# Patient Record
Sex: Female | Born: 1980 | Race: Black or African American | Hispanic: No | Marital: Married | Smoking: Never smoker
Health system: Southern US, Community
[De-identification: ages and names within clinical notes are randomized; demographics above are authoritative.]

## PROBLEM LIST (undated history)

## (undated) DIAGNOSIS — R51 Headache: Secondary | ICD-10-CM

## (undated) DIAGNOSIS — O24419 Gestational diabetes mellitus in pregnancy, unspecified control: Secondary | ICD-10-CM

---

## 2006-09-06 ENCOUNTER — Inpatient Hospital Stay (HOSPITAL_COMMUNITY): Admission: AD | Admit: 2006-09-06 | Discharge: 2006-09-11 | Payer: Self-pay | Admitting: Obstetrics and Gynecology

## 2006-10-30 ENCOUNTER — Emergency Department (HOSPITAL_COMMUNITY): Admission: EM | Admit: 2006-10-30 | Discharge: 2006-10-31 | Payer: Self-pay | Admitting: Emergency Medicine

## 2010-01-30 DIAGNOSIS — O24419 Gestational diabetes mellitus in pregnancy, unspecified control: Secondary | ICD-10-CM

## 2010-01-30 HISTORY — DX: Gestational diabetes mellitus in pregnancy, unspecified control: O24.419

## 2010-03-02 ENCOUNTER — Encounter: Payer: Self-pay | Admitting: Obstetrics and Gynecology

## 2010-05-10 LAB — ANTIBODY SCREEN: Antibody Screen: NEGATIVE

## 2010-05-10 LAB — ABO/RH: RH Type: POSITIVE

## 2010-05-10 LAB — RUBELLA ANTIBODY, IGM: Rubella: IMMUNE

## 2010-05-25 ENCOUNTER — Other Ambulatory Visit: Payer: Self-pay | Admitting: Obstetrics and Gynecology

## 2010-05-25 DIAGNOSIS — E041 Nontoxic single thyroid nodule: Secondary | ICD-10-CM

## 2010-05-27 ENCOUNTER — Other Ambulatory Visit: Payer: Self-pay

## 2010-06-14 ENCOUNTER — Ambulatory Visit
Admission: RE | Admit: 2010-06-14 | Discharge: 2010-06-14 | Disposition: A | Discharge status: Home / Self Care | Payer: PRIVATE HEALTH INSURANCE | Source: Ambulatory Visit | Attending: Obstetrics and Gynecology | Admitting: Obstetrics and Gynecology

## 2010-06-14 DIAGNOSIS — E041 Nontoxic single thyroid nodule: Secondary | ICD-10-CM

## 2010-06-14 NOTE — Op Note (Signed)
Emily Friedman, Emily Friedman              ACCOUNT NO.:  [id number]   MEDICAL RECORD NO.:  [id number]          PATIENT TYPE:  INP   LOCATION:  9162                          FACILITY:  [facility]   PHYSICIAN:  Kendra H. Tenny Craw, MD     DATE OF BIRTH:  08-Aug-1980   DATE OF PROCEDURE:  09/08/2006  DATE OF DISCHARGE:                               OPERATIVE REPORT   PREOPERATIVE DIAGNOSIS:  1. 41 and 3-week intrauterine pregnancy.  2. Nonreassuring fetal well-being fetal intolerance to labor.  3. Cephalopelvic disproportion.   POSTOPERATIVE DIAGNOSIS:  1. 41 and 3-week intrauterine pregnancy.  2. Nonreassuring fetal well-being fetal intolerance to labor.  3. Cephalopelvic disproportion.   PROCEDURE:  Primary low transverse cesarean section via Pfannenstiel's  skin incision.   SURGEON:  Freddrick March. Tenny Craw, M.D.   ASSISTANTOsborne Casco, surgical tech.   ANESTHESIA:  Epidural.   SPECIMEN:  Placenta.   DISPOSITION:  Pathology to labor and delivery.   ESTIMATED BLOOD LOSS:  800 mL.   COMPLICATIONS:  Vacuum pop-off x3.   FINDINGS:  Viable female infant in the vertex presentation with Apgars 5  and 9 weighing 8 pounds 3 ounces.  Cord pH was drawn at the time of  delivery.  However, this clotted and workup was not able to be  processed.   PROCEDURE:  The patient is a 30 year old G1 P0 who was admitted on  September 06, 2006 for a post dates induction.  She underwent induction of  labor with misoprostol and Pitocin and made it to 4 cm dilated where she  remained for 4 hours. During this time the fetus developed recurrent  fetal heart rate decelerations and the patient will be placed in  Trendelenburg, Pitocin would be decreased, fetal heart tones was  returned to baseline and reactive however the patient could never get  into adequate labor and the fetus could not tolerate adequate  contractions to produce cervical change.  Therefore the decision was  made to proceed with primary cesarean section.   DESCRIPTION OF PROCEDURE:  Following the appropriate informed consent  the patient was brought to the operating room where epidural anesthesia  was found to be adequate.  She was placed in the dorsal supine position  with a leftward tilt, prepped and draped in normal sterile fashion.  Scalpel was used to make a Pfannenstiel's skin incision which was  carried down through the underlying layers of soft tissue to the fascia.  The fascia was incised in the midline.  The fascial incision was  extended laterally with Mayo scissors.  The superior aspect of the  fascial incision was grasped with Kocher clamps x2, tented up. The  underlying rectus muscle dissected off sharply with the electrocautery  unit.  The same procedure was repeated on the inferior aspect of the  fascial incision. The rectus muscles were then separated in the midline.  The abdominal peritoneum was identified, tented up and entered sharply  with the Metzenbaum scissors. The incision was extended superiorly and  inferiorly with good visualization of the bladder.  The bladder blade  was inserted.  It was noted  that upon attempting insertion of the  bladder blade the patient's rectus muscles were very very tight and  there was some difficulty stretching the abdominal muscles.  It was  attempted to extend the fascial dissection up further however this could  not be obtained. The vesicouterine peritoneum was then identified,  tented up, entered sharply Metzenbaum scissors and the incision was  extended laterally with the Metzenbaum and a bladder flap was created  digitally.  Scalpel was then used to make a low transverse incision on  the uterus which was extended laterally with blunt dissection.  The  fetal vertex was identified and brought up to the uterine incision.  The  vacuum extractor was used to assist with delivery, however, the infant's  head had very tight fit through the rectus muscles and the abdominal  muscles were so  contracted that the uterine incision could not be  extended further with bandage scissors.  The rectus muscles were then  transected slightly on either side to allow more room. The vacuum popped  off x3 before the eventual delivery of the infant's head followed by the  body.  The cord was clamped and cut and the infant was passed to the  waiting pediatricians. The placenta was then manually extracted.  The  uterus was exteriorized and cleared of all clot and debris. A small  extension towards the cervix of the uterine incision was noted on the  left-hand side of the uterus and the uterine incision was repaired with  #1 chromic in a running locked fashion and the second imbricating layer  was made.  The ovaries and tubes were inspected and found to be within  normal limits.  The uterine incision was inspected and found to be  hemostatic.  The uterus was then returned to the abdominal cavity.  The  abdominal cavity was irrigated and cleared of all clot and debris.  The  uterine incision was reinspected and found to be hemostatic and the  abdominal peritoneum was closed with 2-0 Vicryl in a running fashion and  the fascia was closed with a looped PDS in a running fashion and the  skin was closed with staples.  All sponge, lap, and needle counts were  correct x2.  The patient tolerated the procedure well and was brought to  the recovery room in stable condition following the procedure.      Freddrick March. Tenny Craw, MD  Electronically Signed     KHR/MEDQ  D:  09/08/2006  T:  09/09/2006  Job:  [id number]

## 2010-06-17 NOTE — Discharge Summary (Signed)
Emily Friedman, Emily Friedman              ACCOUNT NO.:  [id number]   MEDICAL RECORD NO.:  [id number]          PATIENT TYPE:  INP   LOCATION:  9102                          FACILITY:  [facility]   PHYSICIAN:  Carrington Clamp, M.D. DATE OF BIRTH:  1980/08/13   DATE OF ADMISSION:  09/06/2006  DATE OF DISCHARGE:  09/11/2006                               DISCHARGE SUMMARY   FINAL DIAGNOSES:  1. Intrauterine pregnancy at 69 and 3/7 weeks' gestation.  2. Induction of labor.  3. Nonreassuring fetal well-being and intolerance to labor.  4. Cephalopelvic disproportion.   PROCEDURE:  Primary low transverse cesarean section.   SURGEON:  Dr. Waynard Reeds.   COMPLICATIONS:  There was vacuum-assisted delivery with pop-off x3.  No  other complications.   This 30 year old G1, P0 was admitted on August 7 for an induction  secondary to post date.  The patient's antepartum course up to this  point had been uncomplicated.  The patient had an uneventful antepartum  course.  She had a negative group B strep culture obtained in our office  at 36 weeks.  She was admitted to the hospital on the 7th for this  induction of labor.  She was given misoprostol and Pitocin. She made it  to about 4 cm of dilation.  At this point, the fetus started developing  some recurrent late fetal heart rate decelerations, and the patient was  placed in Trendelenburg and Pitocin was decreased.  Fetal heart tones  did return to baseline.  However, the patient could never get into an  adequate labor pattern without the abnormality of the fetal heart rate.  At this point, a decision was made to proceed with a cesarean section  secondary to nonreassuring fetal well-being with intolerance to labor.  The patient was taken to the operating room by Dr. Waynard Reeds on September 08, 2006, where a primary low transverse cesarean section was performed  with the delivery of an 8-pound 3-ounce female infant with Apgars of 5 and  9.  Cord pH was unable to  be obtained secondary to clotting.  Delivery  went without complications.  The patient's postoperative course was  benign.  She did have some postoperative anemia and was started on some  iron.  She did have her little boy circumcised before discharge, and she  was felt ready for discharge on postoperative day #3.  She received the  rubella vaccine prior to discharge which she had needed.  She was sent  home with Percocet 1 to 2 every 4 to 6 hours as needed for pain, told to  continue her vitamins, and was to follow up in our office in 4 weeks.  Instructions and precautions were reviewed with the patient.   LABS ON DISCHARGE:  The patient had a hemoglobin of 10.4, white blood  cell count of 10.9, and platelets of 194,000.      Leilani Able, P.A.-C.      Carrington Clamp, M.D.  Electronically Signed    MB/MEDQ  D:  10/10/2006  T:  10/11/2006  Job:  [id number]

## 2010-09-07 ENCOUNTER — Encounter: Payer: PRIVATE HEALTH INSURANCE | Attending: Obstetrics and Gynecology

## 2010-09-07 DIAGNOSIS — Z713 Dietary counseling and surveillance: Secondary | ICD-10-CM | POA: Insufficient documentation

## 2010-09-07 DIAGNOSIS — O99810 Abnormal glucose complicating pregnancy: Secondary | ICD-10-CM | POA: Insufficient documentation

## 2010-09-07 NOTE — Progress Notes (Signed)
  Patient was seen on 09/07/2010 for Gestational Diabetes self-management class at the [facility]. The following learning objectives were met by the patient during this course:   States the definition of Gestational Diabetes  States why dietary management is important in controlling blood glucose  Describes the effects each nutrient has on blood glucose levels  Demonstrates ability to create a balanced meal plan  Demonstrates carbohydrate counting   States when to check blood glucose levels  Demonstrates proper blood glucose monitoring techniques  States the effect of stress and exercise on blood glucose levels  States the importance of limiting caffeine and abstaining from alcohol and smoking  Blood glucose monitor given: Accu-Chek Nano (SmartView blood Glucose Monitoring Kit) Lot # [id number] Exp: 12/30/2011 Blood glucose reading: 120 (This was a fasting level, she has been fasting for religious holiday.)  Patient instructed to monitor glucose levels:Fasteing and 2 hours after first bite each meal. FBS: 60 - <90 1 hour: <140 2 hour: <120  Patient will be seen for follow-up as needed.  Asked to call or e-mail with questions

## 2010-10-12 LAB — STREP B DNA PROBE: GBS: POSITIVE

## 2010-11-06 ENCOUNTER — Encounter (HOSPITAL_COMMUNITY): Payer: Self-pay | Admitting: Obstetrics and Gynecology

## 2010-11-06 ENCOUNTER — Encounter (HOSPITAL_COMMUNITY): Payer: Self-pay

## 2010-11-06 ENCOUNTER — Inpatient Hospital Stay (HOSPITAL_COMMUNITY): Payer: PRIVATE HEALTH INSURANCE

## 2010-11-06 ENCOUNTER — Encounter (HOSPITAL_COMMUNITY): Payer: Self-pay | Admitting: *Deleted

## 2010-11-06 ENCOUNTER — Inpatient Hospital Stay (HOSPITAL_COMMUNITY)
Admission: AD | Admit: 2010-11-06 | Discharge: 2010-11-09 | DRG: 766 | Disposition: A | Discharge status: Home / Self Care | Payer: PRIVATE HEALTH INSURANCE | Source: Ambulatory Visit | Attending: Obstetrics & Gynecology | Admitting: Obstetrics & Gynecology

## 2010-11-06 ENCOUNTER — Encounter (HOSPITAL_COMMUNITY)
Admission: AD | Disposition: A | Discharge status: Home / Self Care | Payer: Self-pay | Source: Ambulatory Visit | Attending: Obstetrics & Gynecology

## 2010-11-06 DIAGNOSIS — O99814 Abnormal glucose complicating childbirth: Secondary | ICD-10-CM | POA: Diagnosis present

## 2010-11-06 DIAGNOSIS — O34219 Maternal care for unspecified type scar from previous cesarean delivery: Principal | ICD-10-CM | POA: Diagnosis present

## 2010-11-06 HISTORY — DX: Gestational diabetes mellitus in pregnancy, unspecified control: O24.419

## 2010-11-06 LAB — ABO/RH: ABO/RH(D): O POS

## 2010-11-06 LAB — CBC
HCT: 33.3 % — ABNORMAL LOW (ref 36.0–46.0)
MCH: 29.5 pg (ref 26.0–34.0)
MCHC: 33 g/dL (ref 30.0–36.0)
RBC: 3.73 MIL/uL — ABNORMAL LOW (ref 3.87–5.11)

## 2010-11-06 LAB — RPR: RPR Ser Ql: NONREACTIVE

## 2010-11-06 SURGERY — Surgical Case
Anesthesia: Regional | Site: Abdomen | Wound class: Clean Contaminated

## 2010-11-06 MED ORDER — LANOLIN HYDROUS EX OINT: 1.0000 "application " | TOPICAL_OINTMENT | CUTANEOUS | Status: DC | PRN

## 2010-11-06 MED ORDER — WITCH HAZEL-GLYCERIN EX PADS: 1.0000 "application " | MEDICATED_PAD | CUTANEOUS | Status: DC | PRN

## 2010-11-06 MED ORDER — SENNOSIDES-DOCUSATE SODIUM 8.6-50 MG PO TABS
2.0000 | ORAL_TABLET | Freq: Every day | ORAL | Status: DC
  Administered 2010-11-07 – 2010-11-08 (×2): 2 via ORAL

## 2010-11-06 MED ORDER — SODIUM CHLORIDE 0.9 % IJ SOLN: 3.0000 mL | INTRAMUSCULAR | Status: DC | PRN

## 2010-11-06 MED ORDER — HYDROMORPHONE HCL 1 MG/ML IJ SOLN: 0.2500 mg | INTRAMUSCULAR | Status: DC | PRN

## 2010-11-06 MED ORDER — FAMOTIDINE IN NACL 20-0.9 MG/50ML-% IV SOLN
20.0000 mg | Freq: Once | INTRAVENOUS | Status: AC
  Administered 2010-11-06: 20 mg via INTRAVENOUS

## 2010-11-06 MED ORDER — MORPHINE SULFATE (PF) 0.5 MG/ML IJ SOLN
INTRAMUSCULAR | Status: DC | PRN
  Administered 2010-11-06: .1 mg via INTRATHECAL

## 2010-11-06 MED ORDER — OXYTOCIN 20 UNITS IN LACTATED RINGERS INFUSION - SIMPLE
INTRAVENOUS | Status: DC | PRN
  Administered 2010-11-06: 20 [IU] via INTRAVENOUS

## 2010-11-06 MED ORDER — SODIUM CHLORIDE 0.9 % IV SOLN
1.0000 ug/kg/h | INTRAVENOUS | Status: DC | PRN
  Filled 2010-11-06: qty 2.5

## 2010-11-06 MED ORDER — NALOXONE HCL 0.4 MG/ML IJ SOLN: 0.4000 mg | INTRAMUSCULAR | Status: DC | PRN

## 2010-11-06 MED ORDER — EPHEDRINE SULFATE 50 MG/ML IJ SOLN
INTRAMUSCULAR | Status: DC | PRN
  Administered 2010-11-06: 5 mg via INTRAVENOUS
  Administered 2010-11-06: 15 mg via INTRAVENOUS
  Administered 2010-11-06: 5 mg via INTRAVENOUS
  Administered 2010-11-06: 10 mg via INTRAVENOUS

## 2010-11-06 MED ORDER — SIMETHICONE 80 MG PO CHEW: 80.0000 mg | CHEWABLE_TABLET | ORAL | Status: DC | PRN

## 2010-11-06 MED ORDER — NALBUPHINE SYRINGE 5 MG/0.5 ML
5.0000 mg | INJECTION | INTRAMUSCULAR | Status: DC | PRN
  Filled 2010-11-06: qty 1

## 2010-11-06 MED ORDER — OXYTOCIN 20 UNITS IN LACTATED RINGERS INFUSION - SIMPLE
125.0000 mL/h | INTRAVENOUS | Status: AC
  Administered 2010-11-06: 125 mL/h via INTRAVENOUS

## 2010-11-06 MED ORDER — FAMOTIDINE IN NACL 20-0.9 MG/50ML-% IV SOLN
INTRAVENOUS | Status: AC
  Administered 2010-11-06: 20 mg via INTRAVENOUS
  Filled 2010-11-06: qty 50

## 2010-11-06 MED ORDER — FENTANYL CITRATE 0.05 MG/ML IJ SOLN
INTRAMUSCULAR | Status: DC | PRN
  Administered 2010-11-06: 15 ug via INTRAVENOUS

## 2010-11-06 MED ORDER — ONDANSETRON HCL 4 MG/2ML IJ SOLN
4.0000 mg | Freq: Three times a day (TID) | INTRAMUSCULAR | Status: DC | PRN
  Filled 2010-11-06: qty 2

## 2010-11-06 MED ORDER — MENTHOL 3 MG MT LOZG: 1.0000 | LOZENGE | OROMUCOSAL | Status: DC | PRN

## 2010-11-06 MED ORDER — KETOROLAC TROMETHAMINE 30 MG/ML IJ SOLN: 30.0000 mg | Freq: Four times a day (QID) | INTRAMUSCULAR | Status: AC | PRN

## 2010-11-06 MED ORDER — ONDANSETRON HCL 4 MG PO TABS: 4.0000 mg | ORAL_TABLET | ORAL | Status: DC | PRN

## 2010-11-06 MED ORDER — DIPHENHYDRAMINE HCL 25 MG PO CAPS: 25.0000 mg | ORAL_CAPSULE | Freq: Four times a day (QID) | ORAL | Status: DC | PRN

## 2010-11-06 MED ORDER — CITRIC ACID-SODIUM CITRATE 334-500 MG/5ML PO SOLN
30.0000 mL | Freq: Once | ORAL | Status: AC
  Administered 2010-11-06: 30 mL via ORAL

## 2010-11-06 MED ORDER — SCOPOLAMINE 1 MG/3DAYS TD PT72
1.0000 | MEDICATED_PATCH | Freq: Once | TRANSDERMAL | Status: DC
  Administered 2010-11-06: 1.5 mg via TRANSDERMAL
  Filled 2010-11-06: qty 1

## 2010-11-06 MED ORDER — SODIUM CHLORIDE 0.9 % IR SOLN
Status: DC | PRN
  Administered 2010-11-06: 1000 mL

## 2010-11-06 MED ORDER — CITRIC ACID-SODIUM CITRATE 334-500 MG/5ML PO SOLN
ORAL | Status: AC
  Administered 2010-11-06: 30 mL via ORAL
  Filled 2010-11-06: qty 15

## 2010-11-06 MED ORDER — METOCLOPRAMIDE HCL 5 MG/ML IJ SOLN
10.0000 mg | Freq: Once | INTRAMUSCULAR | Status: AC
  Administered 2010-11-06: 10 mg via INTRAVENOUS
  Filled 2010-11-06: qty 2

## 2010-11-06 MED ORDER — DIPHENHYDRAMINE HCL 50 MG/ML IJ SOLN: 12.5000 mg | INTRAMUSCULAR | Status: DC | PRN

## 2010-11-06 MED ORDER — TETANUS-DIPHTH-ACELL PERTUSSIS 5-2.5-18.5 LF-MCG/0.5 IM SUSP
0.5000 mL | Freq: Once | INTRAMUSCULAR | Status: AC
  Administered 2010-11-07: 0.5 mL via INTRAMUSCULAR

## 2010-11-06 MED ORDER — OXYCODONE-ACETAMINOPHEN 5-325 MG PO TABS
1.0000 | ORAL_TABLET | ORAL | Status: DC | PRN
  Administered 2010-11-07 – 2010-11-08 (×5): 1 via ORAL
  Filled 2010-11-06 (×5): qty 1

## 2010-11-06 MED ORDER — ONDANSETRON HCL 4 MG/2ML IJ SOLN
INTRAMUSCULAR | Status: DC | PRN
  Administered 2010-11-06: 4 mg via INTRAVENOUS

## 2010-11-06 MED ORDER — SIMETHICONE 80 MG PO CHEW
80.0000 mg | CHEWABLE_TABLET | Freq: Three times a day (TID) | ORAL | Status: DC
  Administered 2010-11-07 – 2010-11-08 (×8): 80 mg via ORAL

## 2010-11-06 MED ORDER — DIPHENHYDRAMINE HCL 50 MG/ML IJ SOLN: 25.0000 mg | INTRAMUSCULAR | Status: DC | PRN

## 2010-11-06 MED ORDER — DIBUCAINE 1 % RE OINT: 1.0000 "application " | TOPICAL_OINTMENT | RECTAL | Status: DC | PRN

## 2010-11-06 MED ORDER — METOCLOPRAMIDE HCL 5 MG/ML IJ SOLN
INTRAMUSCULAR | Status: AC
  Administered 2010-11-06: 10 mg via INTRAVENOUS
  Filled 2010-11-06: qty 2

## 2010-11-06 MED ORDER — PRENATAL PLUS 27-1 MG PO TABS
1.0000 | ORAL_TABLET | Freq: Every day | ORAL | Status: DC
  Administered 2010-11-07 – 2010-11-08 (×2): 1 via ORAL
  Filled 2010-11-06 (×3): qty 1

## 2010-11-06 MED ORDER — NALBUPHINE SYRINGE 5 MG/0.5 ML
5.0000 mg | INJECTION | INTRAMUSCULAR | Status: DC | PRN
  Administered 2010-11-07: 10 mg via INTRAVENOUS
  Filled 2010-11-06 (×3): qty 1

## 2010-11-06 MED ORDER — ZOLPIDEM TARTRATE 5 MG PO TABS: 5.0000 mg | ORAL_TABLET | Freq: Every evening | ORAL | Status: DC | PRN

## 2010-11-06 MED ORDER — KETOROLAC TROMETHAMINE 30 MG/ML IJ SOLN
30.0000 mg | Freq: Four times a day (QID) | INTRAMUSCULAR | Status: AC | PRN
  Administered 2010-11-06 – 2010-11-07 (×2): 30 mg via INTRAVENOUS
  Filled 2010-11-06 (×2): qty 1

## 2010-11-06 MED ORDER — CEFAZOLIN SODIUM 1-5 GM-% IV SOLN
INTRAVENOUS | Status: DC | PRN
  Administered 2010-11-06: 1 g via INTRAVENOUS

## 2010-11-06 MED ORDER — ONDANSETRON HCL 4 MG/2ML IJ SOLN
4.0000 mg | INTRAMUSCULAR | Status: DC | PRN
  Administered 2010-11-06: 4 mg via INTRAVENOUS

## 2010-11-06 MED ORDER — LACTATED RINGERS IV SOLN
INTRAVENOUS | Status: DC
  Administered 2010-11-06 (×2): via INTRAVENOUS

## 2010-11-06 MED ORDER — BUTORPHANOL TARTRATE 2 MG/ML IJ SOLN
1.0000 mg | Freq: Once | INTRAMUSCULAR | Status: AC
  Administered 2010-11-06: 1 mg via INTRAVENOUS
  Filled 2010-11-06: qty 1

## 2010-11-06 MED ORDER — BUPIVACAINE IN DEXTROSE 0.75-8.25 % IT SOLN
INTRATHECAL | Status: DC | PRN
  Administered 2010-11-06: 1.5 mL via INTRATHECAL

## 2010-11-06 MED ORDER — IBUPROFEN 600 MG PO TABS: 600.0000 mg | ORAL_TABLET | Freq: Four times a day (QID) | ORAL | Status: DC

## 2010-11-06 MED ORDER — LACTATED RINGERS IV SOLN
INTRAVENOUS | Status: DC
  Administered 2010-11-07: 02:00:00 via INTRAVENOUS

## 2010-11-06 MED ORDER — CEFAZOLIN SODIUM 1-5 GM-% IV SOLN
1.0000 g | INTRAVENOUS | Status: DC
  Filled 2010-11-06: qty 50

## 2010-11-06 MED ORDER — DIPHENHYDRAMINE HCL 25 MG PO CAPS: 25.0000 mg | ORAL_CAPSULE | ORAL | Status: DC | PRN

## 2010-11-06 MED ORDER — IBUPROFEN 600 MG PO TABS: 600.0000 mg | ORAL_TABLET | Freq: Four times a day (QID) | ORAL | Status: DC | PRN

## 2010-11-06 MED ORDER — MEPERIDINE HCL 25 MG/ML IJ SOLN: 6.2500 mg | INTRAMUSCULAR | Status: DC | PRN

## 2010-11-06 MED ORDER — KETOROLAC TROMETHAMINE 60 MG/2ML IM SOLN
60.0000 mg | Freq: Once | INTRAMUSCULAR | Status: AC | PRN
  Administered 2010-11-06: 60 mg via INTRAMUSCULAR

## 2010-11-06 MED ORDER — METOCLOPRAMIDE HCL 5 MG/ML IJ SOLN: 10.0000 mg | Freq: Three times a day (TID) | INTRAMUSCULAR | Status: DC | PRN

## 2010-11-06 SURGICAL SUPPLY — 31 items
CLOTH BEACON ORANGE TIMEOUT ST (SAFETY) ×2 IMPLANT
DRESSING TELFA 8X3 (GAUZE/BANDAGES/DRESSINGS) IMPLANT
DRSG COVADERM 4X10 (GAUZE/BANDAGES/DRESSINGS) ×2 IMPLANT
DRSG PAD ABDOMINAL 8X10 ST (GAUZE/BANDAGES/DRESSINGS) ×2 IMPLANT
ELECT REM PT RETURN 9FT ADLT (ELECTROSURGICAL) ×2
ELECTRODE REM PT RTRN 9FT ADLT (ELECTROSURGICAL) ×1 IMPLANT
EXTRACTOR VACUUM M CUP 4 TUBE (SUCTIONS) IMPLANT
GAUZE SPONGE 4X4 12PLY STRL LF (GAUZE/BANDAGES/DRESSINGS) ×4 IMPLANT
GLOVE BIO SURGEON STRL SZ7.5 (GLOVE) ×4 IMPLANT
GOWN PREVENTION PLUS LG XLONG (DISPOSABLE) ×2 IMPLANT
GOWN PREVENTION PLUS XLARGE (GOWN DISPOSABLE) ×2 IMPLANT
KIT ABG SYR 3ML LUER SLIP (SYRINGE) IMPLANT
NEEDLE HYPO 25X5/8 SAFETYGLIDE (NEEDLE) IMPLANT
NS IRRIG 1000ML POUR BTL (IV SOLUTION) ×2 IMPLANT
PACK C SECTION WH (CUSTOM PROCEDURE TRAY) ×2 IMPLANT
PAD ABD 7.5X8 STRL (GAUZE/BANDAGES/DRESSINGS) ×2 IMPLANT
SLEEVE SCD COMPRESS KNEE MED (MISCELLANEOUS) ×2 IMPLANT
STAPLER VISISTAT 35W (STAPLE) ×2 IMPLANT
SUT PLAIN 1 NONE 54 (SUTURE) IMPLANT
SUT PROLENE 0 TP 1 60 (SUTURE) IMPLANT
SUT VIC AB 0 CT1 27 (SUTURE) ×3
SUT VIC AB 0 CT1 27XBRD ANBCTR (SUTURE) ×3 IMPLANT
SUT VIC AB 1 CTX 36 (SUTURE) ×2
SUT VIC AB 1 CTX36XBRD ANBCTRL (SUTURE) ×2 IMPLANT
SUT VIC AB 3-0 SH 27 (SUTURE)
SUT VIC AB 3-0 SH 27X BRD (SUTURE) IMPLANT
SUT VICRYL 4-0 PS2 18IN ABS (SUTURE) IMPLANT
TAPE CLOTH SURG 4X10 WHT LF (GAUZE/BANDAGES/DRESSINGS) ×2 IMPLANT
TOWEL OR 17X24 6PK STRL BLUE (TOWEL DISPOSABLE) ×2 IMPLANT
TRAY FOLEY CATH 14FR (SET/KITS/TRAYS/PACK) ×2 IMPLANT
WATER STERILE IRR 1000ML POUR (IV SOLUTION) ×2 IMPLANT

## 2010-11-06 NOTE — Progress Notes (Signed)
Nursery RN has come to PACU to check on the patient. Pt has told the RN that she wants to wait to nurse her infant when she gets to her room.  Pt states that she feels like she can not nurse due to spinal and medication.

## 2010-11-06 NOTE — Progress Notes (Signed)
Pt presents to [facility]U with complaints of contractions that started at 0700. Pt is here for a labor eval.

## 2010-11-06 NOTE — Consult Note (Signed)
Neonatology Note:  Attendance at C-section:  I was asked to attend this repeat C/S at term. The mother is a G2P1 O pos, GBS positive with onset of labor. Mother is a diet-controlled GDM. ROM at delivery, fluid clear. Infant vigorous with good spontaneous cry and tone. Needed only minimal bulb suctioning. Ap 9/9. Lungs clear to ausc in DR. To CN to care of Pediatrician.  Louay Myrie, MD  

## 2010-11-06 NOTE — Op Note (Signed)
Preoperative diagnoses-  #1-term pregnancy( EDC 10/7), early active labor, previous cesarean section  Postop diagnoses-  Same plus delivery of 6 lbs. 9 oz. Female infant Apgars 9 and 9  Procedure-  Repeat low transverse cesarean section  Surgeon-Dr. Aldona Bar  Anesthesia-spinal-Dr. Cassidy  History-this 30 year old gravida 2 para 1 was evaluated in MAU where she presented in early labor on her due date. She had had a previous cesarean section in August of 2008 4 was felt to be CPD with an arrest at 4 cm and delivery of an 8 lbs. 3 oz. Infant. This pregnancy was complicated by diet-controlled gestational diabetes and according to the patient a consent form was signed in the office for a VBAC.  My evaluation of the patient in triage estimated fetal weight to be 7-7-1/2 pounds and her cervix was a centimeter dilated 60% effaced and the vertex was at -3 station. The patient was very uncomfortable. Fetal heart rate was reassuring. After a discussion with the patient and subsequently her husband decision was made to proceed with a repeat cesarean section primarily based on her cervix being so unfavorable.  Procedure-the patient was ultimately taken to the operating room where a spinal anesthetic was carried out by Dr. Rodman Pickle. 1 g of Ancef IV was given preoperatively and a time out was carried out. The patient was prepped and draped in good anesthetic levels were documented as part of the prep a Foley catheter was placed.  A Pfannenstiel incision was made through the old scar and dissected down to and through the fascia in a low transverse fashion with hemostasis created at each layer. Subfascial space was created inferiorly and superiorly muscle separated in midline peritoneum identified and entered appropriately with care taken to avoid the bowel superiorly and the bladder inferiorly. The bladder blade was placed and the vesicouterine peritoneum was incised in low transverse fashion and pushed off the  lower uterine segment with ease. Sharp incision into the uterus with the Metzenbaum scissors was then carried out in low transverse fashion and extended laterally with the fingers an amniotomy produced with production of clear fluid. The vertex was not well fixed in the pelvis and the baby tried to turn into a shoulder presentation but nonetheless was delivered from a vertex position. The infant, a female, cried spontaneously at once and after the cord was clamped and cut the infant was passed off to the waiting team and ultimately taken to the nursery in good condition. Subsequent weight was found to be 6 lbs. 9 oz. And Apgars were 9 and 9. Cord bloods were collected and thereafter the placenta was delivered intact.  At this time the uterus was exteriorized and rendered free of any remaining products of conception. Good uterine contraction early was afforded with slowly given intravenous Pitocin and manual stimulation. Closure of the uterine incision was then carried out using a single layer of #1 Vicryl in a running locking fashion and this was oversewn with several figure-of-eight #1 Vicryl for additional hemostasis. At this time tubes and ovaries were noted to be normal and the abdomen was lavaged all free blood and clot. All counts at this time were noted to be correct and no foreign bodies were noted to be remaining in the abdominal cavity.The uterus was replaced into the abdominal cavity and closure of the abdomen was begun.  The abdominal peritoneum was reapproximated with 0 Vicryl in a running fashion and muscles were secured with same. Assured of that subfascial hemostasis the fascia was then reapproximated  using 0 Vicryl from angle to midline bilaterally. Subcutaneous tissue was rendered hemostatic and staples were then used to close the skin. A sterile pressure dressing was applied and ultimately the patient was transferred to the recovery room in satisfactory condition having tolerated the procedure  well.  All counts correct x2.  At the conclusion of the procedure both mother and baby were doing well in the respective recovery areas.

## 2010-11-06 NOTE — Anesthesia Procedure Notes (Signed)
Spinal Block  Patient location during procedure: OR Start time: 11/06/2010 4:00 PM Staffing Performed by: anesthesiologist  Preanesthetic Checklist Completed: patient identified, site marked, surgical consent, pre-op evaluation, timeout performed, IV checked, risks and benefits discussed and monitors and equipment checked Spinal Block Patient position: sitting Prep: site prepped and draped and DuraPrep Patient monitoring: heart rate, cardiac monitor, continuous pulse ox and blood pressure Approach: midline Location: L3-4 Injection technique: single-shot Needle Needle type: Sprotte  Needle gauge: 24 G Needle length: 9 cm Assessment Sensory level: T4 Additional Notes Clear free flow CSF on 1st attempt.

## 2010-11-06 NOTE — H&P (Signed)
30 year old Female EDC 11-06-10, [redacted] weeks pregnant, who presents in early active labor.  She is a G2, P1, previous C/S in 08/2006 for Failure to progress beyond 4 cm -- then delivered a 8 pound 3 oz baby.  This pregnancy she was diagnosed as a diet controlled Gestational Diabetic and apparently has had good control of her sugars.  She did discuss a VBAC attempt in the office but no consent form is present in her scanned record and I could not find her chart in the office today.  She said her last appointment was 9-25 at which time she had an U/S for EFW and that showed the baby was 6+ pounds (by her history -- chart for that visit not available).  PE:  VS stable Abd:  S+39-40.  FH reassuring.   CX:  1cm at best/50-60 % effaced/vtx -3-2.  IMP:  Term IUP, Previous C/S, Early Labor, Diet controlled GDM Plan:  As her Cx now is relatively unfavorable, I do not believe she would be a good VBAC candidate.  Plus, she is a diet controlled GDM and I'm guessing EFW is 7-8 pounds and she only progressed to 4 cm with her first baby which was 8 pounds 3 oz.  She understands and we will proceed with a Repeat C/S soon.  Discussed with her husband too.

## 2010-11-06 NOTE — Anesthesia Preprocedure Evaluation (Signed)
Anesthesia Evaluation  Name, MR# and DOB Patient awake  General Assessment Comment  Reviewed: Allergy & Precautions, H&P , NPO status , Patient's Chart, lab work & pertinent test results, reviewed documented beta blocker date and time   History of Anesthesia Complications Negative for: history of anesthetic complications  Airway Mallampati: II TM Distance: >3 FB Neck ROM: full    Dental  (+) Loose,    Pulmonary Recent URI  (no fever.  cough and nasal congestion x1 week)  clear to auscultation        Cardiovascular regular Normal    Neuro/Psych Negative Neurological ROS  Negative Psych ROS   GI/Hepatic negative GI ROS Neg liver ROS    Endo/Other  Diabetes mellitus-, Gestational  Renal/GU negative Renal ROS     Musculoskeletal   Abdominal   Peds  Hematology negative hematology ROS (+)   Anesthesia Other Findings   Reproductive/Obstetrics (+) Pregnancy (h/o prior c/s x1)                           Anesthesia Physical Anesthesia Plan  ASA: II  Anesthesia Plan: Spinal   Post-op Pain Management:    Induction:   Airway Management Planned:   Additional Equipment:   Intra-op Plan:   Post-operative Plan:   Informed Consent: I have reviewed the patients History and Physical, chart, labs and discussed the procedure including the risks, benefits and alternatives for the proposed anesthesia with the patient or authorized representative who has indicated his/her understanding and acceptance.   Dental Advisory Given  Plan Discussed with: Surgeon and CRNA  Anesthesia Plan Comments:         Anesthesia Quick Evaluation

## 2010-11-06 NOTE — Progress Notes (Signed)
Nursery has called and states that infant is crying and wants to nurse. Pt refused at this time and states that infant may have formula at this time.

## 2010-11-06 NOTE — ED Notes (Signed)
Dr. Rodman Pickle notified of patient last eat/drink. Orders received. Dr. Rodman Pickle will be by to see patient prior to OR

## 2010-11-06 NOTE — Anesthesia Postprocedure Evaluation (Signed)
Anesthesia Post Note  Patient: Emily Friedman  Procedure(s) Performed:  CESAREAN SECTION - Repeat cesarean section with delivery of baby girl at 68. Apgars 9/9.  Anesthesia type: Spinal  Patient location: PACU  Post pain: Pain level controlled  Post assessment: Post-op Vital signs reviewed  Last Vitals:  Filed Vitals:   11/06/10 1835  BP: 122/69  Pulse: 56  Temp: 97.9 F (36.6 C)  Resp: 20    Post vital signs: Reviewed  Level of consciousness: awake  Complications: No apparent anesthesia complications

## 2010-11-06 NOTE — Transfer of Care (Signed)
Immediate Anesthesia Transfer of Care Note  Patient: Emily Friedman  Procedure(s) Performed:  CESAREAN SECTION - Repeat cesarean section with delivery of baby girl at 76. Apgars 9/9.  Patient Location: PACU  Anesthesia Type: Spinal  Level of Consciousness: awake, alert , oriented and patient cooperative  Airway & Oxygen Therapy: Patient Spontanous Breathing  Post-op Assessment: Report given to PACU RN and Post -op Vital signs reviewed and stable  Post vital signs: Reviewed  Complications: No apparent anesthesia complications

## 2010-11-06 NOTE — OR Nursing (Signed)
Uterus massaged by S. Valoree Agent RN.  Two tubes of cord blood to lab.  

## 2010-11-06 NOTE — Progress Notes (Signed)
Dr. Aldona Bar notified of patient arrival to MAU with labor. Pt is G2 P1 at 40.0 weeks. Pt is prior c-section with plans of Vbac. Dr. Aldona Bar states he does not feel the patient is a canidate for vback and will be by to talk to patient about c-section. Orders received to keep patient NPO

## 2010-11-07 ENCOUNTER — Encounter (HOSPITAL_COMMUNITY): Payer: Self-pay | Admitting: Obstetrics & Gynecology

## 2010-11-07 LAB — CBC
MCV: 88.9 fL (ref 78.0–100.0)
Platelets: 199 10*3/uL (ref 150–400)
RBC: 3.41 MIL/uL — ABNORMAL LOW (ref 3.87–5.11)
RDW: 15.6 % — ABNORMAL HIGH (ref 11.5–15.5)
WBC: 8.1 10*3/uL (ref 4.0–10.5)

## 2010-11-07 MED ORDER — IBUPROFEN 600 MG PO TABS
600.0000 mg | ORAL_TABLET | Freq: Four times a day (QID) | ORAL | Status: DC
  Administered 2010-11-07 – 2010-11-09 (×9): 600 mg via ORAL
  Filled 2010-11-07 (×9): qty 1

## 2010-11-07 NOTE — Progress Notes (Signed)
Encounter addended by: Madison Hickman on: 11/07/2010  8:20 AM<BR>     Documentation filed: Notes Section

## 2010-11-07 NOTE — Progress Notes (Signed)
Patient is eating, ambulating, voiding.  No flatus. Pain control is good. Congested from cold virus.    Filed Vitals:   11/07/10 0012 11/07/10 0135 11/07/10 0326 11/07/10 0545  BP: 107/60 103/63 107/63 113/68  Pulse:  52 65 51  Temp:  97.7 F (36.5 C) 98 F (36.7 C) 97.7 F (36.5 C)  TempSrc:  Oral Oral Oral  Resp:  16 18 16   Weight:      SpO2:  100% 100% 100%    [employer]: c/d/i Ext: no CT  Lab Results  Component Value Date   WBC 8.1 11/07/2010   HGB 10.1* 11/07/2010   HCT 30.3* 11/07/2010   MCV 88.9 11/07/2010   PLT 199 11/07/2010    --/--/O POS (10/07 1456)  A/P POD#1 s/p Rc/s   Routine care.  Expect d/c per plan.    Philip Aspen

## 2010-11-07 NOTE — Anesthesia Postprocedure Evaluation (Signed)
  Anesthesia Post-op Note  Patient: Emily Friedman  Procedure(s) Performed:  CESAREAN SECTION - Repeat cesarean section with delivery of baby girl at 73. Apgars 9/9.  Patient Location: Mother/Baby  Anesthesia Type: Spinal  Level of Consciousness: awake, alert  and oriented  Airway and Oxygen Therapy: Patient Spontanous Breathing  Post-op Pain: none  Post-op Assessment: Post-op Vital signs reviewed and Patient's Cardiovascular Status Stable  Post-op Vital Signs: Reviewed and stable  Complications: No apparent anesthesia complications

## 2010-11-08 NOTE — Progress Notes (Signed)
POD#2 Pt without c/o. Lochia-wnl VVSAF IMP/stable PLAN/routine care.

## 2010-11-09 MED ORDER — IBUPROFEN 600 MG PO TABS: 600.0000 mg | ORAL_TABLET | Freq: Four times a day (QID) | ORAL | Status: AC | PRN

## 2010-11-09 MED ORDER — OXYCODONE-ACETAMINOPHEN 5-325 MG PO TABS: 2.0000 | ORAL_TABLET | ORAL | Status: AC | PRN

## 2010-11-09 NOTE — Progress Notes (Signed)
Subjective: Postpartum Day 3: Cesarean Delivery Patient reports no complaints  Objective: Vital signs in last 24 hours: Filed Vitals:   11/08/10 0540 11/08/10 1413 11/08/10 2103 11/09/10 0512  BP: 108/72 113/73 114/68 116/73  Pulse: 50 56 84 61  Temp: 98.4 F (36.9 C) 98.9 F (37.2 C) 97.8 F (36.6 C) 97.3 F (36.3 C)  TempSrc: Oral Oral Oral Oral  Resp: 18 18 24 18   Weight:      SpO2:         Physical Exam:  General: AOX3, NAD Lochia: appropriate Uterine Fundus: firm Incision: C/D/I DVT Evaluation: No evidence DVT   Basename 11/07/10 0515 11/06/10 1456  HGB 10.1* 11.0*  HCT 30.3* 33.3*    Assessment/Plan: Status post Cesarean section. Doing well Ready for d/c home  Di Jasmer H. 11/09/2010, 8:03 AM

## 2010-11-09 NOTE — Discharge Summary (Signed)
Obstetric Discharge Summary Reason for Admission: onset of labor Prenatal Procedures: NST and ultrasound Intrapartum Procedures: cesarean: low cervical, transverse Postpartum Procedures: none Complications-Operative and Postpartum: none Hemoglobin  Date Value Range Status  11/07/2010 10.1* 12.0-15.0 (g/dL) Final     HCT  Date Value Range Status  11/07/2010 30.3* 36.0-46.0 (%) Final    Discharge Diagnoses: Term Pregnancy-delivered  Discharge Information: Date: 11/09/2010 Activity: Light activity, no lifting greater than 20 lbs Diet: routine Medications: Ibuprofen and Percocet Condition: stable Instructions: refer to practice specific booklet Discharge to: home Follow-up Information    Follow up with WEIN,ROBERT M in 4 weeks. (For postpartum evaluationi)    Contact information:   [address] [place] [address] [phone]          Newborn Data: Live born female  Birth Weight: 6 lb 9.6 oz (2995 g) APGAR: 9, 9  Home with mother.  Trei Schoch H. 11/09/2010, 9:41 AM

## 2010-11-10 LAB — BASIC METABOLIC PANEL
BUN: 9
Calcium: 9.7
Creatinine, Ser: 0.48
GFR calc non Af Amer: >60
Glucose, Bld: 116 — ABNORMAL HIGH

## 2010-11-10 LAB — CBC
HCT: 35.2 — ABNORMAL LOW
Platelets: 321
RDW: 14.3 — ABNORMAL HIGH

## 2010-11-10 LAB — URINALYSIS, ROUTINE W REFLEX MICROSCOPIC
Bilirubin Urine: NEGATIVE
Ketones, ur: NEGATIVE
Nitrite: NEGATIVE
Urobilinogen, UA: 1

## 2010-11-10 LAB — DIFFERENTIAL
Basophils Absolute: 0
Eosinophils Relative: 1
Lymphocytes Relative: 30
Neutro Abs: 4.9

## 2010-11-10 LAB — PREGNANCY, URINE: Preg Test, Ur: NEGATIVE

## 2010-11-14 LAB — CBC
HCT: 37.1
Hemoglobin: 10.4 — ABNORMAL LOW
MCHC: 34.5
Platelets: 272
RBC: 3.33 — ABNORMAL LOW
WBC: 7.4

## 2011-09-14 ENCOUNTER — Other Ambulatory Visit: Payer: Self-pay

## 2011-10-19 ENCOUNTER — Other Ambulatory Visit (HOSPITAL_COMMUNITY): Payer: Self-pay | Admitting: Obstetrics and Gynecology

## 2011-10-19 DIAGNOSIS — O358XX0 Maternal care for other (suspected) fetal abnormality and damage, not applicable or unspecified: Secondary | ICD-10-CM

## 2011-10-25 ENCOUNTER — Ambulatory Visit (HOSPITAL_COMMUNITY)
Admission: RE | Admit: 2011-10-25 | Discharge: 2011-10-25 | Disposition: A | Discharge status: Home / Self Care | Payer: BC Managed Care – PPO | Source: Ambulatory Visit | Attending: Obstetrics and Gynecology | Admitting: Obstetrics and Gynecology

## 2011-10-25 ENCOUNTER — Encounter (HOSPITAL_COMMUNITY): Payer: Self-pay

## 2011-10-25 DIAGNOSIS — Z1389 Encounter for screening for other disorder: Secondary | ICD-10-CM | POA: Insufficient documentation

## 2011-10-25 DIAGNOSIS — O358XX0 Maternal care for other (suspected) fetal abnormality and damage, not applicable or unspecified: Secondary | ICD-10-CM | POA: Insufficient documentation

## 2011-10-25 DIAGNOSIS — O368390 Maternal care for abnormalities of the fetal heart rate or rhythm, unspecified trimester, not applicable or unspecified: Secondary | ICD-10-CM | POA: Insufficient documentation

## 2011-10-25 DIAGNOSIS — O09299 Supervision of pregnancy with other poor reproductive or obstetric history, unspecified trimester: Secondary | ICD-10-CM | POA: Insufficient documentation

## 2011-10-25 DIAGNOSIS — Z363 Encounter for antenatal screening for malformations: Secondary | ICD-10-CM | POA: Insufficient documentation

## 2011-10-25 NOTE — Progress Notes (Signed)
Ms. Emmons was seen for ultrasound appointment today.  Please see [facility] report for details.

## 2012-03-04 ENCOUNTER — Encounter (HOSPITAL_COMMUNITY): Payer: Self-pay | Admitting: Pharmacist

## 2012-03-04 LAB — OB RESULTS CONSOLE GBS: GBS: POSITIVE

## 2012-03-15 ENCOUNTER — Encounter (HOSPITAL_COMMUNITY): Payer: Self-pay

## 2012-03-15 ENCOUNTER — Encounter (HOSPITAL_COMMUNITY)
Admission: RE | Admit: 2012-03-15 | Discharge: 2012-03-15 | Disposition: A | Discharge status: Home / Self Care | Payer: BC Managed Care – PPO | Source: Ambulatory Visit | Attending: Obstetrics and Gynecology | Admitting: Obstetrics and Gynecology

## 2012-03-15 HISTORY — DX: Headache: R51

## 2012-03-15 LAB — TYPE AND SCREEN
ABO/RH(D): O POS
Antibody Screen: NEGATIVE

## 2012-03-15 LAB — CBC
Hemoglobin: 11.5 g/dL — ABNORMAL LOW (ref 12.0–15.0)
MCHC: 34.2 g/dL (ref 30.0–36.0)
RBC: 3.79 MIL/uL — ABNORMAL LOW (ref 3.87–5.11)
WBC: 5.5 10*3/uL (ref 4.0–10.5)

## 2012-03-15 LAB — BASIC METABOLIC PANEL
CO2: 20 mEq/L (ref 19–32)
Calcium: 9.1 mg/dL (ref 8.4–10.5)
Potassium: 3 mEq/L — ABNORMAL LOW (ref 3.5–5.1)
Sodium: 136 mEq/L (ref 135–145)

## 2012-03-15 LAB — RPR: RPR Ser Ql: NONREACTIVE

## 2012-03-15 NOTE — Pre-Procedure Instructions (Signed)
Dr. Cristela Blue notified of pt's K+ result of 3.0

## 2012-03-15 NOTE — Patient Instructions (Signed)
Your procedure is scheduled on:03/19/12  Enter through the Main Entrance at :1030 am Pick up desk phone and dial [phone] and inform us of your arrival.  Please call [phone] if you have any problems the morning of surgery.  Remember: Do not eat after midnight:MONDAY Water ok until 8am on TUESDAY   Take these meds the morning of surgery with a sip of water:none  DO NOT wear jewelry, eye make-up, lipstick,body lotion, or dark fingernail polish. Do not shave for 48 hours prior to surgery.  If you are to be admitted after surgery, leave suitcase in car until your room has been assigned. Patients discharged on the day of surgery will not be allowed to drive home.

## 2012-03-19 ENCOUNTER — Encounter (HOSPITAL_COMMUNITY): Payer: Self-pay | Admitting: *Deleted

## 2012-03-19 ENCOUNTER — Encounter (HOSPITAL_COMMUNITY)
Admission: AD | Disposition: A | Discharge status: Home / Self Care | Payer: Self-pay | Source: Ambulatory Visit | Attending: Obstetrics & Gynecology

## 2012-03-19 ENCOUNTER — Inpatient Hospital Stay (HOSPITAL_COMMUNITY)
Admission: RE | Admit: 2012-03-19 | Payer: BC Managed Care – PPO | Source: Ambulatory Visit | Admitting: Obstetrics and Gynecology

## 2012-03-19 ENCOUNTER — Inpatient Hospital Stay (HOSPITAL_COMMUNITY): Payer: BC Managed Care – PPO | Admitting: Anesthesiology

## 2012-03-19 ENCOUNTER — Inpatient Hospital Stay (HOSPITAL_COMMUNITY)
Admission: AD | Admit: 2012-03-19 | Discharge: 2012-03-21 | DRG: 651 | Disposition: A | Discharge status: Home / Self Care | Payer: BC Managed Care – PPO | Source: Ambulatory Visit | Attending: Obstetrics & Gynecology | Admitting: Obstetrics & Gynecology

## 2012-03-19 ENCOUNTER — Encounter (HOSPITAL_COMMUNITY): Payer: Self-pay | Admitting: Anesthesiology

## 2012-03-19 DIAGNOSIS — Z22330 Carrier of Group B streptococcus: Secondary | ICD-10-CM

## 2012-03-19 DIAGNOSIS — O99892 Other specified diseases and conditions complicating childbirth: Secondary | ICD-10-CM | POA: Diagnosis present

## 2012-03-19 DIAGNOSIS — O34219 Maternal care for unspecified type scar from previous cesarean delivery: Secondary | ICD-10-CM | POA: Diagnosis present

## 2012-03-19 DIAGNOSIS — O4290 Premature rupture of membranes, unspecified as to length of time between rupture and onset of labor, unspecified weeks of gestation: Principal | ICD-10-CM | POA: Diagnosis present

## 2012-03-19 LAB — CBC
HCT: 33.7 % — ABNORMAL LOW (ref 36.0–46.0)
Hemoglobin: 11.4 g/dL — ABNORMAL LOW (ref 12.0–15.0)
MCHC: 33.8 g/dL (ref 30.0–36.0)
WBC: 6.3 10*3/uL (ref 4.0–10.5)

## 2012-03-19 LAB — TYPE AND SCREEN

## 2012-03-19 SURGERY — Surgical Case
Anesthesia: Spinal | Site: Abdomen | Wound class: Clean Contaminated

## 2012-03-19 SURGERY — Surgical Case
Anesthesia: Regional

## 2012-03-19 MED ORDER — SIMETHICONE 80 MG PO CHEW
80.0000 mg | CHEWABLE_TABLET | Freq: Three times a day (TID) | ORAL | Status: DC
  Administered 2012-03-19 – 2012-03-21 (×7): 80 mg via ORAL

## 2012-03-19 MED ORDER — DIPHENHYDRAMINE HCL 25 MG PO CAPS: 25.0000 mg | ORAL_CAPSULE | ORAL | Status: DC | PRN

## 2012-03-19 MED ORDER — KETOROLAC TROMETHAMINE 30 MG/ML IJ SOLN
30.0000 mg | Freq: Four times a day (QID) | INTRAMUSCULAR | Status: AC | PRN
  Filled 2012-03-19: qty 1

## 2012-03-19 MED ORDER — BUPIVACAINE IN DEXTROSE 0.75-8.25 % IT SOLN
INTRATHECAL | Status: DC | PRN
  Administered 2012-03-19: 13 mg via INTRATHECAL

## 2012-03-19 MED ORDER — METOCLOPRAMIDE HCL 5 MG/ML IJ SOLN: 10.0000 mg | Freq: Three times a day (TID) | INTRAMUSCULAR | Status: DC | PRN

## 2012-03-19 MED ORDER — OXYCODONE-ACETAMINOPHEN 5-325 MG PO TABS
1.0000 | ORAL_TABLET | ORAL | Status: DC | PRN
  Administered 2012-03-20 – 2012-03-21 (×4): 1 via ORAL
  Filled 2012-03-19 (×4): qty 1

## 2012-03-19 MED ORDER — OXYTOCIN 10 UNIT/ML IJ SOLN
INTRAMUSCULAR | Status: AC
  Filled 2012-03-19: qty 4

## 2012-03-19 MED ORDER — IBUPROFEN 600 MG PO TABS
600.0000 mg | ORAL_TABLET | Freq: Four times a day (QID) | ORAL | Status: DC | PRN
  Filled 2012-03-19 (×4): qty 1

## 2012-03-19 MED ORDER — SCOPOLAMINE 1 MG/3DAYS TD PT72
1.0000 | MEDICATED_PATCH | Freq: Once | TRANSDERMAL | Status: DC
  Administered 2012-03-19: 1.5 mg via TRANSDERMAL

## 2012-03-19 MED ORDER — SENNOSIDES-DOCUSATE SODIUM 8.6-50 MG PO TABS
2.0000 | ORAL_TABLET | Freq: Every day | ORAL | Status: DC
  Administered 2012-03-20: 2 via ORAL

## 2012-03-19 MED ORDER — DIPHENHYDRAMINE HCL 50 MG/ML IJ SOLN
INTRAMUSCULAR | Status: AC
  Filled 2012-03-19: qty 1

## 2012-03-19 MED ORDER — ONDANSETRON HCL 4 MG/2ML IJ SOLN: 4.0000 mg | Freq: Three times a day (TID) | INTRAMUSCULAR | Status: DC | PRN

## 2012-03-19 MED ORDER — LACTATED RINGERS IV SOLN
INTRAVENOUS | Status: DC | PRN
  Administered 2012-03-19: 06:00:00 via INTRAVENOUS

## 2012-03-19 MED ORDER — DIPHENHYDRAMINE HCL 50 MG/ML IJ SOLN: 25.0000 mg | INTRAMUSCULAR | Status: DC | PRN

## 2012-03-19 MED ORDER — DIPHENHYDRAMINE HCL 25 MG PO CAPS: 25.0000 mg | ORAL_CAPSULE | Freq: Four times a day (QID) | ORAL | Status: DC | PRN

## 2012-03-19 MED ORDER — NALOXONE HCL 1 MG/ML IJ SOLN: 1.0000 ug/kg/h | INTRAVENOUS | Status: DC | PRN

## 2012-03-19 MED ORDER — KETOROLAC TROMETHAMINE 30 MG/ML IJ SOLN: 30.0000 mg | Freq: Four times a day (QID) | INTRAMUSCULAR | Status: AC | PRN

## 2012-03-19 MED ORDER — FENTANYL CITRATE 0.05 MG/ML IJ SOLN: 25.0000 ug | INTRAMUSCULAR | Status: DC | PRN

## 2012-03-19 MED ORDER — PHENYLEPHRINE HCL 10 MG/ML IJ SOLN
INTRAMUSCULAR | Status: DC | PRN
  Administered 2012-03-19: 40 ug via INTRAVENOUS
  Administered 2012-03-19 (×8): 80 ug via INTRAVENOUS

## 2012-03-19 MED ORDER — LACTATED RINGERS IV SOLN
INTRAVENOUS | Status: DC
  Administered 2012-03-19: 18:00:00 via INTRAVENOUS

## 2012-03-19 MED ORDER — PRENATAL MULTIVITAMIN CH
1.0000 | ORAL_TABLET | Freq: Every day | ORAL | Status: DC
  Administered 2012-03-20 – 2012-03-21 (×2): 1 via ORAL
  Filled 2012-03-19 (×2): qty 1

## 2012-03-19 MED ORDER — SCOPOLAMINE 1 MG/3DAYS TD PT72
MEDICATED_PATCH | TRANSDERMAL | Status: AC
  Administered 2012-03-19: 1.5 mg via TRANSDERMAL
  Filled 2012-03-19: qty 1

## 2012-03-19 MED ORDER — NALOXONE HCL 1 MG/ML IJ SOLN: 1.0000 ug/kg/h | INTRAMUSCULAR | Status: DC | PRN

## 2012-03-19 MED ORDER — OXYTOCIN 10 UNIT/ML IJ SOLN
40.0000 [IU] | INTRAVENOUS | Status: DC | PRN
  Administered 2012-03-19: 40 [IU] via INTRAVENOUS

## 2012-03-19 MED ORDER — CEFAZOLIN SODIUM-DEXTROSE 2-3 GM-% IV SOLR
2.0000 g | Freq: Once | INTRAVENOUS | Status: AC
  Administered 2012-03-19: 2 g via INTRAVENOUS
  Filled 2012-03-19: qty 50

## 2012-03-19 MED ORDER — METOCLOPRAMIDE HCL 5 MG/ML IJ SOLN
10.0000 mg | Freq: Three times a day (TID) | INTRAMUSCULAR | Status: DC | PRN
  Administered 2012-03-19: 10 mg via INTRAVENOUS

## 2012-03-19 MED ORDER — MEPERIDINE HCL 25 MG/ML IJ SOLN: 6.2500 mg | INTRAMUSCULAR | Status: DC | PRN

## 2012-03-19 MED ORDER — SODIUM CHLORIDE 0.9 % IJ SOLN: 3.0000 mL | INTRAMUSCULAR | Status: DC | PRN

## 2012-03-19 MED ORDER — ZOLPIDEM TARTRATE 5 MG PO TABS: 5.0000 mg | ORAL_TABLET | Freq: Every evening | ORAL | Status: DC | PRN

## 2012-03-19 MED ORDER — ONDANSETRON HCL 4 MG/2ML IJ SOLN
INTRAMUSCULAR | Status: AC
  Filled 2012-03-19: qty 2

## 2012-03-19 MED ORDER — MENTHOL 3 MG MT LOZG: 1.0000 | LOZENGE | OROMUCOSAL | Status: DC | PRN

## 2012-03-19 MED ORDER — CEFAZOLIN SODIUM-DEXTROSE 2-3 GM-% IV SOLR: 2.0000 g | INTRAVENOUS | Status: DC

## 2012-03-19 MED ORDER — SIMETHICONE 80 MG PO CHEW: 80.0000 mg | CHEWABLE_TABLET | ORAL | Status: DC | PRN

## 2012-03-19 MED ORDER — NALOXONE HCL 0.4 MG/ML IJ SOLN: 0.4000 mg | INTRAMUSCULAR | Status: DC | PRN

## 2012-03-19 MED ORDER — OXYTOCIN 40 UNITS IN LACTATED RINGERS INFUSION - SIMPLE MED: 62.5000 mL/h | INTRAVENOUS | Status: AC

## 2012-03-19 MED ORDER — LACTATED RINGERS IV SOLN: INTRAVENOUS | Status: DC

## 2012-03-19 MED ORDER — CEFAZOLIN SODIUM-DEXTROSE 2-3 GM-% IV SOLR
INTRAVENOUS | Status: AC
  Filled 2012-03-19: qty 50

## 2012-03-19 MED ORDER — WITCH HAZEL-GLYCERIN EX PADS: 1.0000 "application " | MEDICATED_PAD | CUTANEOUS | Status: DC | PRN

## 2012-03-19 MED ORDER — TETANUS-DIPHTH-ACELL PERTUSSIS 5-2.5-18.5 LF-MCG/0.5 IM SUSP: 0.5000 mL | Freq: Once | INTRAMUSCULAR | Status: DC

## 2012-03-19 MED ORDER — MORPHINE SULFATE (PF) 0.5 MG/ML IJ SOLN
INTRAMUSCULAR | Status: DC | PRN
  Administered 2012-03-19: .15 mg via INTRATHECAL

## 2012-03-19 MED ORDER — NALBUPHINE HCL 10 MG/ML IJ SOLN
5.0000 mg | INTRAMUSCULAR | Status: DC | PRN
  Administered 2012-03-19: 10 mg via SUBCUTANEOUS
  Filled 2012-03-19: qty 1

## 2012-03-19 MED ORDER — KETOROLAC TROMETHAMINE 60 MG/2ML IM SOLN
60.0000 mg | Freq: Once | INTRAMUSCULAR | Status: AC | PRN
  Administered 2012-03-19: 60 mg via INTRAMUSCULAR

## 2012-03-19 MED ORDER — FENTANYL CITRATE 0.05 MG/ML IJ SOLN
INTRAMUSCULAR | Status: AC
  Filled 2012-03-19: qty 2

## 2012-03-19 MED ORDER — KETOROLAC TROMETHAMINE 30 MG/ML IJ SOLN
30.0000 mg | Freq: Four times a day (QID) | INTRAMUSCULAR | Status: AC | PRN
  Administered 2012-03-19: 30 mg via INTRAVENOUS

## 2012-03-19 MED ORDER — SCOPOLAMINE 1 MG/3DAYS TD PT72: 1.0000 | MEDICATED_PATCH | Freq: Once | TRANSDERMAL | Status: DC

## 2012-03-19 MED ORDER — PHENYLEPHRINE 40 MCG/ML (10ML) SYRINGE FOR IV PUSH (FOR BLOOD PRESSURE SUPPORT)
PREFILLED_SYRINGE | INTRAVENOUS | Status: AC
  Filled 2012-03-19: qty 10

## 2012-03-19 MED ORDER — NALBUPHINE SYRINGE 5 MG/0.5 ML
5.0000 mg | INJECTION | INTRAMUSCULAR | Status: DC | PRN
  Administered 2012-03-19: 5 mg via INTRAVENOUS
  Filled 2012-03-19 (×2): qty 1

## 2012-03-19 MED ORDER — NALBUPHINE HCL 10 MG/ML IJ SOLN: 5.0000 mg | INTRAMUSCULAR | Status: DC | PRN

## 2012-03-19 MED ORDER — NALBUPHINE HCL 10 MG/ML IJ SOLN
5.0000 mg | INTRAMUSCULAR | Status: DC | PRN
  Filled 2012-03-19: qty 1

## 2012-03-19 MED ORDER — DIPHENHYDRAMINE HCL 50 MG/ML IJ SOLN: 12.5000 mg | INTRAMUSCULAR | Status: DC | PRN

## 2012-03-19 MED ORDER — SCOPOLAMINE 1 MG/3DAYS TD PT72
1.0000 | MEDICATED_PATCH | Freq: Once | TRANSDERMAL | Status: DC
  Filled 2012-03-19: qty 1

## 2012-03-19 MED ORDER — LACTATED RINGERS IV SOLN
INTRAVENOUS | Status: DC | PRN
  Administered 2012-03-19: 05:00:00 via INTRAVENOUS

## 2012-03-19 MED ORDER — IBUPROFEN 600 MG PO TABS
600.0000 mg | ORAL_TABLET | Freq: Four times a day (QID) | ORAL | Status: DC
  Administered 2012-03-19 – 2012-03-21 (×6): 600 mg via ORAL

## 2012-03-19 MED ORDER — LANOLIN HYDROUS EX OINT: 1.0000 "application " | TOPICAL_OINTMENT | CUTANEOUS | Status: DC | PRN

## 2012-03-19 MED ORDER — DIPHENHYDRAMINE HCL 50 MG/ML IJ SOLN
INTRAMUSCULAR | Status: DC | PRN
  Administered 2012-03-19: 50 mg via INTRAVENOUS

## 2012-03-19 MED ORDER — METOCLOPRAMIDE HCL 5 MG/ML IJ SOLN
INTRAMUSCULAR | Status: AC
  Administered 2012-03-19: 10 mg via INTRAVENOUS
  Filled 2012-03-19: qty 2

## 2012-03-19 MED ORDER — CITRIC ACID-SODIUM CITRATE 334-500 MG/5ML PO SOLN
ORAL | Status: AC
  Administered 2012-03-19: 30 mL
  Filled 2012-03-19: qty 15

## 2012-03-19 MED ORDER — IBUPROFEN 600 MG PO TABS
600.0000 mg | ORAL_TABLET | Freq: Four times a day (QID) | ORAL | Status: DC | PRN
  Filled 2012-03-19: qty 1

## 2012-03-19 MED ORDER — MORPHINE SULFATE 0.5 MG/ML IJ SOLN
INTRAMUSCULAR | Status: AC
  Filled 2012-03-19: qty 10

## 2012-03-19 MED ORDER — DIBUCAINE 1 % RE OINT: 1.0000 "application " | TOPICAL_OINTMENT | RECTAL | Status: DC | PRN

## 2012-03-19 MED ORDER — FENTANYL CITRATE 0.05 MG/ML IJ SOLN
INTRAMUSCULAR | Status: DC | PRN
  Administered 2012-03-19: 25 ug via INTRATHECAL

## 2012-03-19 MED ORDER — ONDANSETRON HCL 4 MG/2ML IJ SOLN: 4.0000 mg | INTRAMUSCULAR | Status: DC | PRN

## 2012-03-19 MED ORDER — ONDANSETRON HCL 4 MG PO TABS: 4.0000 mg | ORAL_TABLET | ORAL | Status: DC | PRN

## 2012-03-19 MED ORDER — ONDANSETRON HCL 4 MG/2ML IJ SOLN
INTRAMUSCULAR | Status: DC | PRN
  Administered 2012-03-19: 4 mg via INTRAVENOUS

## 2012-03-19 SURGICAL SUPPLY — 33 items
CLOTH BEACON ORANGE TIMEOUT ST (SAFETY) ×2 IMPLANT
CONTAINER PREFILL 10% NBF 15ML (MISCELLANEOUS) IMPLANT
DRAPE LG THREE QUARTER DISP (DRAPES) ×2 IMPLANT
DRSG OPSITE POSTOP 4X10 (GAUZE/BANDAGES/DRESSINGS) ×2 IMPLANT
DURAPREP 26ML APPLICATOR (WOUND CARE) ×2 IMPLANT
ELECT REM PT RETURN 9FT ADLT (ELECTROSURGICAL) ×2
ELECTRODE REM PT RTRN 9FT ADLT (ELECTROSURGICAL) ×1 IMPLANT
EXTRACTOR VACUUM M CUP 4 TUBE (SUCTIONS) ×2 IMPLANT
GLOVE ECLIPSE 6.0 STRL STRAW (GLOVE) ×2 IMPLANT
GLOVE ECLIPSE 6.5 STRL STRAW (GLOVE) IMPLANT
GOWN PREVENTION PLUS LG XLONG (DISPOSABLE) ×6 IMPLANT
KIT ABG SYR 3ML LUER SLIP (SYRINGE) IMPLANT
NEEDLE HYPO 25X5/8 SAFETYGLIDE (NEEDLE) IMPLANT
NS IRRIG 1000ML POUR BTL (IV SOLUTION) ×2 IMPLANT
PACK C SECTION WH (CUSTOM PROCEDURE TRAY) ×2 IMPLANT
PAD OB MATERNITY 4.3X12.25 (PERSONAL CARE ITEMS) ×2 IMPLANT
RTRCTR C-SECT PINK 25CM LRG (MISCELLANEOUS) ×2 IMPLANT
SLEEVE SCD COMPRESS KNEE MED (MISCELLANEOUS) ×2 IMPLANT
STAPLER VISISTAT 35W (STAPLE) ×2 IMPLANT
SUT PLAIN 0 NONE (SUTURE) IMPLANT
SUT VIC AB 0 CT1 27 (SUTURE) ×3
SUT VIC AB 0 CT1 27XBRD ANBCTR (SUTURE) ×3 IMPLANT
SUT VIC AB 1 CTX 36 (SUTURE) ×2
SUT VIC AB 1 CTX36XBRD ANBCTRL (SUTURE) ×2 IMPLANT
SUT VIC AB 3-0 CT1 27 (SUTURE) ×1
SUT VIC AB 3-0 CT1 TAPERPNT 27 (SUTURE) ×1 IMPLANT
SUT VIC AB 3-0 PS2 18 (SUTURE)
SUT VIC AB 3-0 PS2 18XBRD (SUTURE) IMPLANT
SUT VIC AB 3-0 SH 27 (SUTURE)
SUT VIC AB 3-0 SH 27X BRD (SUTURE) IMPLANT
TOWEL OR 17X24 6PK STRL BLUE (TOWEL DISPOSABLE) ×6 IMPLANT
TRAY FOLEY CATH 14FR (SET/KITS/TRAYS/PACK) ×2 IMPLANT
WATER STERILE IRR 1000ML POUR (IV SOLUTION) ×2 IMPLANT

## 2012-03-19 NOTE — Progress Notes (Signed)
Subjective: Postpartum Day 0: Cesarean Delivery Patient reports feeling well. Pain is controlled, bleeding is appropriate.  Objective: Vital signs in last 24 hours: Temp:  [96.3 F (35.7 C)-98.1 F (36.7 C)] 97.6 F (36.4 C) (02/18 0845) Pulse Rate:  [54-80] 56 (02/18 0845) Resp:  [12-24] 16 (02/18 0845) BP: (99-140)/(44-96) 122/75 mmHg (02/18 0845) SpO2:  [96 %-100 %] 96 % (02/18 0845) Weight:  [75.751 kg (167 lb)] 75.751 kg (167 lb) (02/18 0346)  Physical Exam:  General: alert, cooperative and appears stated age Lochia: appropriate Uterine Fundus: firm Incision: healing well DVT Evaluation: No evidence of DVT seen on physical exam.   Recent Labs  03/19/12 0313  HGB 11.4*  HCT 33.7*    Assessment/Plan: Status post Cesarean section. Doing well postoperatively.  Continue current care. Desires neonatal circ. Unable to perform at this time due to age of infant.  Will have done tomorrow  Felita Bump H. 03/19/2012, 9:40 AM

## 2012-03-19 NOTE — Anesthesia Preprocedure Evaluation (Addendum)
Anesthesia Evaluation  Patient identified by MRN, date of birth, ID band Patient awake    Reviewed: Allergy & Precautions, H&P , Patient's Chart, lab work & pertinent test results  Airway Mallampati: II TM Distance: >3 FB Neck ROM: Full    Dental no notable dental hx. (+) Teeth Intact and Caps   Pulmonary neg pulmonary ROS,  breath sounds clear to auscultation  Pulmonary exam normal       Cardiovascular negative cardio ROS  Rhythm:Regular Rate:Normal + Systolic murmurs    Neuro/Psych negative neurological ROS  negative psych ROS   GI/Hepatic negative GI ROS, Neg liver ROS,   Endo/Other  negative endocrine ROS  Renal/GU negative Renal ROS  negative genitourinary   Musculoskeletal   Abdominal Normal abdominal exam  (+)   Peds  Hematology negative hematology ROS (+)   Anesthesia Other Findings   Reproductive/Obstetrics (+) Pregnancy                          Anesthesia Physical Anesthesia Plan  ASA: II and emergent  Anesthesia Plan: Spinal   Post-op Pain Management:    Induction:   Airway Management Planned: Natural Airway  Additional Equipment:   Intra-op Plan:   Post-operative Plan:   Informed Consent: I have reviewed the patients History and Physical, chart, labs and discussed the procedure including the risks, benefits and alternatives for the proposed anesthesia with the patient or authorized representative who has indicated his/her understanding and acceptance.   Dental advisory given  Plan Discussed with: Anesthesiologist, CRNA and Surgeon  Anesthesia Plan Comments:        Anesthesia Quick Evaluation

## 2012-03-19 NOTE — MAU Note (Signed)
Water broke around 1am. Scheduled for a repeat c-section at 11

## 2012-03-19 NOTE — Op Note (Signed)
Patient Name: Emily Friedman MRN: [id number]  Date of Surgery: 03/19/2012    PREOPERATIVE DIAGNOSIS: Previous cesarean section; labor  POSTOPERATIVE DIAGNOSIS: Previous cesarean section, labor   PROCEDURE: Low transverse cesarean section  SURGEON: Caralyn Guile. Arlyce Dice M.D.  ANESTHESIA: Spinal  ESTIMATED BLOOD LOSS:  FINDINGS: Female, Apgars 9,9; clear fluid, BW , normal uterus and adnexa, thin lower segment.   INDICATIONS: This is a 32 y.o.  [name] who is admitted for repeat cesarean section in labor.  PROCEDURE IN DETAIL: The patient was taken to the operating room and spinal anesthesia was placed.  She was then placed in the supine position with left lateral displacement of the uterus. The abdomen was prepped and draped in a sterile fashion and the bladder was catheterized.  A low transverse abdominal incision was made and carried down to the fascia. The fascia was opened transversely and the rectus sheath was dissected from the underlying rectus muscle. The rectus midline was identified and opened by sharp and blunt dissection. The peritoneum was opened. An Alexis retractor was placed and the lower uterine segment was identified, entered transversely by careful sharp dissection, and extended bluntly.  The infant was delivered with the vacuum extractor. The placenta was delivered and the uterus was bluntly curettage. The lower segment was closed with running interlocking Vicryl 1 suture. The peritoneum and rectus muscle were closed in the midline with running 3-0 Vicryl suture. The fascia was closed with running 0 Vicryl suture and the skin was closed with staples. All sponge and instrument counts were correct.  The patient tolerated the procedure well and left the operating room in good condition.

## 2012-03-19 NOTE — Progress Notes (Signed)
Patient has developed regular and painful contractions.  Will proceed with repeat cesarean.

## 2012-03-19 NOTE — H&P (Addendum)
32 y.o. G3P2  Estimated Date of Delivery: 03/20/12 admitted at 39/[redacted] weeks gestation with PROM.  Prenatal Transfer Tool  Maternal Diabetes: No Genetic Screening: Normal Maternal Ultrasounds/Referrals: Normal Fetal Ultrasounds or other Referrals:  None Maternal Substance Abuse:  No Significant Maternal Medications:  None Significant Maternal Lab Results: Lab values include: Group B Strep positive Other Significant Pregnancy Complications:  Previous C/S x 2 (FTP/Failed TOLAC)  Afebrile, VSS Heart and Lungs: No active disease Abdomen: soft, gravid, EFW AGA. Cervical exam:  1 cm/posterior (MAU nurse exam).  Impression: Patient presents with PROM.  Her contractions are mild and 7 minutes apart.  She had a full meal at 12 am this morning.  Dr. Malen Gauze (anesthesia) wants to delay repeat cesarean for at least 8 hours unless active labor develops.  Plan:  Admit.  Will proceed with scheduled repeat cesarean at 12 pm today unless she develops regular painful contractions.  Will give Ancef 2 gm now and repeat pre-op for positive GBS screen.

## 2012-03-19 NOTE — OR Nursing (Signed)
50 ml blood loss during fundal massage by DLWegner RN, cord blood x 2 at OR front desk 

## 2012-03-19 NOTE — Transfer of Care (Signed)
Immediate Anesthesia Transfer of Care Note  Patient: Emily Friedman  Procedure(s) Performed: Procedure(s): Repeat CESAREAN SECTION of baby boy  at 58 APGAR 9/9 (N/A)  Patient Location: PACU  Anesthesia Type:Spinal  Level of Consciousness: awake, alert , oriented and patient cooperative  Airway & Oxygen Therapy: Patient Spontanous Breathing  Post-op Assessment: Report given to PACU RN and Post -op Vital signs reviewed and stable  Post vital signs: Reviewed and stable  Complications: No apparent anesthesia complications

## 2012-03-19 NOTE — Anesthesia Postprocedure Evaluation (Signed)
  Anesthesia Post-op Note  Patient: Emily Friedman  Procedure(s) Performed: Procedure(s): Repeat CESAREAN SECTION of baby boy  at 41 APGAR 9/9 (N/A)  Patient Location: PACU  Anesthesia Type:Spinal  Level of Consciousness: awake, alert  and oriented  Airway and Oxygen Therapy: Patient Spontanous Breathing  Post-op Pain: none  Post-op Assessment: Post-op Vital signs reviewed, Patient's Cardiovascular Status Stable, Respiratory Function Stable, Patent Airway, No signs of Nausea or vomiting, Adequate PO intake, Pain level controlled, No headache and No backache  Post-op Vital Signs: Reviewed and stable  Complications: No apparent anesthesia complications

## 2012-03-19 NOTE — Anesthesia Procedure Notes (Signed)
Spinal  Patient location during procedure: OR Start time: 03/19/2012 5:14 AM Staffing Anesthesiologist: Nickol Collister A. Performed by: anesthesiologist  Preanesthetic Checklist Completed: patient identified, site marked, surgical consent, pre-op evaluation, timeout performed, IV checked, risks and benefits discussed and monitors and equipment checked Spinal Block Patient position: sitting Prep: site prepped and draped and DuraPrep Patient monitoring: heart rate, cardiac monitor, continuous pulse ox and blood pressure Approach: midline Location: L3-4 Injection technique: single-shot Needle Needle type: Sprotte  Needle gauge: 24 G Needle length: 9 cm Needle insertion depth: 5 cm Assessment Sensory level: T4 Additional Notes Patient tolerated procedure well.  Adequate sensory level.

## 2012-03-20 ENCOUNTER — Encounter (HOSPITAL_COMMUNITY): Payer: Self-pay | Admitting: Obstetrics & Gynecology

## 2012-03-20 LAB — BASIC METABOLIC PANEL
CO2: 23 mEq/L (ref 19–32)
Calcium: 8.9 mg/dL (ref 8.4–10.5)
Creatinine, Ser: 0.4 mg/dL — ABNORMAL LOW (ref 0.50–1.10)
GFR calc non Af Amer: >90 mL/min (ref >90)
Sodium: 138 mEq/L (ref 135–145)

## 2012-03-20 LAB — CBC
MCH: 30.2 pg (ref 26.0–34.0)
MCHC: 33.8 g/dL (ref 30.0–36.0)
MCV: 89.4 fL (ref 78.0–100.0)
Platelets: 217 10*3/uL (ref 150–400)
RBC: 3.41 MIL/uL — ABNORMAL LOW (ref 3.87–5.11)

## 2012-03-20 LAB — BIRTH TISSUE RECOVERY COLLECTION (PLACENTA DONATION)

## 2012-03-21 MED ORDER — OXYCODONE-ACETAMINOPHEN 5-325 MG PO TABS: 1.0000 | ORAL_TABLET | ORAL | Status: DC | PRN

## 2012-03-21 MED ORDER — IBUPROFEN 600 MG PO TABS: 600.0000 mg | ORAL_TABLET | Freq: Four times a day (QID) | ORAL | Status: AC | PRN

## 2012-03-21 NOTE — Discharge Summary (Signed)
Obstetric Discharge Summary Reason for Admission: cesarean section and rupture of membranes Prenatal Procedures: none Intrapartum Procedures: cesarean: low cervical, transverse Postpartum Procedures: none Complications-Operative and Postpartum: none Hemoglobin  Date Value Range Status  03/20/2012 10.3* 12.0 - 15.0 g/dL Final     HCT  Date Value Range Status  03/20/2012 30.5* 36.0 - 46.0 % Final    Physical Exam:  General: alert and cooperative Lochia: appropriate Uterine Fundus: firm Incision: healing well, no significant drainage, no significant erythema DVT Evaluation: No evidence of DVT seen on physical exam.  Discharge Diagnoses: Term Pregnancy-delivered  Discharge Information: Date: 03/21/2012 Activity: pelvic rest Diet: routine Medications: PNV, Ibuprofen and Percocet Condition: stable Instructions: refer to practice specific booklet Discharge to: home Follow-up Information   Follow up with Mickel Baas, MD In 4 weeks.   Contact information:   [address] [place] [address] [phone]       Newborn Data: Live born female  Birth Weight: 7 lb 9.2 oz (3435 g) APGAR: 9, 9  Home with mother.  Philip Aspen 03/21/2012, 9:55 AM

## 2013-12-01 ENCOUNTER — Encounter (HOSPITAL_COMMUNITY): Payer: Self-pay | Admitting: Obstetrics & Gynecology

## 2014-02-07 ENCOUNTER — Emergency Department (HOSPITAL_COMMUNITY): Payer: 59

## 2014-02-07 ENCOUNTER — Encounter (HOSPITAL_COMMUNITY): Payer: Self-pay

## 2014-02-07 ENCOUNTER — Emergency Department (HOSPITAL_COMMUNITY)
Admission: EM | Admit: 2014-02-07 | Discharge: 2014-02-07 | Disposition: A | Discharge status: Home / Self Care | Payer: 59 | Attending: Emergency Medicine | Admitting: Emergency Medicine

## 2014-02-07 DIAGNOSIS — Y9389 Activity, other specified: Secondary | ICD-10-CM | POA: Insufficient documentation

## 2014-02-07 DIAGNOSIS — Y998 Other external cause status: Secondary | ICD-10-CM | POA: Insufficient documentation

## 2014-02-07 DIAGNOSIS — E119 Type 2 diabetes mellitus without complications: Secondary | ICD-10-CM | POA: Diagnosis not present

## 2014-02-07 DIAGNOSIS — S3991XA Unspecified injury of abdomen, initial encounter: Secondary | ICD-10-CM | POA: Diagnosis not present

## 2014-02-07 DIAGNOSIS — Y92410 Unspecified street and highway as the place of occurrence of the external cause: Secondary | ICD-10-CM | POA: Insufficient documentation

## 2014-02-07 DIAGNOSIS — Z8632 Personal history of gestational diabetes: Secondary | ICD-10-CM | POA: Diagnosis not present

## 2014-02-07 LAB — I-STAT BETA HCG BLOOD, ED (MC, WL, AP ONLY): I-stat hCG, quantitative: <5 m[IU]/mL (ref <5)

## 2014-02-07 LAB — I-STAT CREATININE, ED: Creatinine, Ser: 0.6 mg/dL (ref 0.50–1.10)

## 2014-02-07 MED ORDER — IOHEXOL 300 MG/ML  SOLN
100.0000 mL | Freq: Once | INTRAMUSCULAR | Status: AC | PRN
Start: 2014-02-07 — End: 2014-02-07
  Administered 2014-02-07: 100 mL via INTRAVENOUS

## 2014-02-07 MED ORDER — KETOROLAC TROMETHAMINE 30 MG/ML IJ SOLN
30.0000 mg | Freq: Once | INTRAMUSCULAR | Status: AC
  Administered 2014-02-07: 30 mg via INTRAVENOUS
  Filled 2014-02-07: qty 1
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
## 2014-02-07 NOTE — ED Notes (Signed)
Pt alert and oriented x4. Respirations even and unlabored, bilateral symmetrical rise and fall of chest. Skin warm and dry. In no acute distress. Denies needs.   
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
## 2014-02-07 NOTE — ED Notes (Signed)
Xray bedside.
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
## 2014-12-09 ENCOUNTER — Ambulatory Visit (INDEPENDENT_AMBULATORY_CARE_PROVIDER_SITE_OTHER): Payer: 59 | Admitting: Obstetrics and Gynecology

## 2014-12-09 ENCOUNTER — Encounter: Payer: Self-pay | Admitting: Obstetrics and Gynecology

## 2014-12-09 VITALS — BP 118/80 | HR 60 | Resp 14 | Ht 65.0 in | Wt 163.0 lb

## 2014-12-09 DIAGNOSIS — Z01419 Encounter for gynecological examination (general) (routine) without abnormal findings: Secondary | ICD-10-CM | POA: Diagnosis not present

## 2014-12-09 DIAGNOSIS — O24419 Gestational diabetes mellitus in pregnancy, unspecified control: Secondary | ICD-10-CM

## 2014-12-09 DIAGNOSIS — Z124 Encounter for screening for malignant neoplasm of cervix: Secondary | ICD-10-CM | POA: Diagnosis not present

## 2014-12-09 LAB — HEMOGLOBIN A1C
Hgb A1c MFr Bld: 5.9 % — ABNORMAL HIGH (ref <5.7)
MEAN PLASMA GLUCOSE: 123 mg/dL — AB (ref <117)

## 2014-12-09 NOTE — Progress Notes (Signed)
Patient ID: Emily Friedman, female   DOB: [birth year], 34 y.o.   MRN: [id number] 34 y.o. G3P3003 [place] here [place] annual exam.  She has a nexplanon, placed in 4/14. Occasional light bleeding. Sexually active, no pain.  Gestational diabetes in her 2nd pregnancy, diet controlled.     Patient's last menstrual period was 07/17/2014.          Sexually active: Yes.    The current method of family planning is Nexplanon   Exercising: Yes.    walking Smoker:  no  Health Maintenance: Pap:  2013 WNL per patient History of abnormal Pap:  no MMG:  Never Colonoscopy:  Never BMD:   Never TDaP:  11-07-10 Gardasil: N/A   reports that she has never smoked. She has never used smokeless tobacco. She reports that she does not drink alcohol or use illicit drugs. 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 Kids are 8, 4 and 2. Middle one is a girl, other 2 are boys.   Past Medical History  Diagnosis Date  . Headache(784.0)   . Gestational diabetes   . Gestational diabetes 2012    Past Surgical History  Procedure Laterality Date  . Cesarean section    . Cesarean section  11/06/2010    Procedure: CESAREAN SECTION;  Surgeon: Caprice Beaverobert M Wein;  Location: [facility];  Service: Gynecology;  Laterality: N/A;  Repeat cesarean section with delivery of baby girl at [id number]. Apgars 9/9.  Marland Kitchen. Cesarean section N/A 03/19/2012    Procedure: Repeat CESAREAN SECTION of baby boy  at 0543 APGAR 9/9;  Surgeon: Mickel Baasichard D Kaplan, MD;  Location: [facility];  Service: Obstetrics;  Laterality: N/A;    Current Outpatient Prescriptions  Medication Sig Dispense Refill  . acetaminophen (TYLENOL) 500 MG tablet Take 500 mg by mouth every 6 (six) hours as needed for headache (headache). For headache    . ibuprofen (ADVIL,MOTRIN) 600 MG tablet Take 1 tablet (600 mg total) by mouth every 6 (six) hours as needed. 30 tablet 0   No current facility-administered medications for this visit.    Family History  Problem Relation Age  of Onset  . Breast cancer Paternal Grandmother   . Hypertension Mother     Review of Systems  Constitutional: Negative.   HENT: Negative.   Eyes: Negative.   Respiratory: Negative.   Cardiovascular: Negative.   Gastrointestinal: Negative.   Endocrine: Negative.   Genitourinary: Negative.   Musculoskeletal: Negative.   Skin: Negative.   Allergic/Immunologic: Negative.   Neurological: Negative.   Psychiatric/Behavioral: Negative.     Exam:   BP 118/80 mmHg  Pulse 60  Resp 14  Ht 5\' 5"  (1.651 m)  Wt 163 lb (73.936 kg)  BMI 27.12 kg/m2  LMP 07/17/2014  Breastfeeding? No  Weight change: @WEIGHTCHANGE @ Height:   Height: 5\' 5"  (165.1 cm)  Ht Readings from Last 3 Encounters:  12/09/14 5\' 5"  (1.651 m)  03/19/12 5\' 6"  (1.676 m)  03/15/12 5\' 6"  (1.676 m)    General appearance: alert, cooperative and appears stated age Head: Normocephalic, without obvious abnormality, atraumatic Neck: no adenopathy, supple, symmetrical, trachea midline and thyroid normal to inspection and palpation Lungs: clear to auscultation bilaterally Breasts: normal appearance, no masses or tenderness Heart: regular rate and rhythm Abdomen: soft, non-tender; bowel sounds normal; no masses,  no organomegaly Extremities: extremities normal, atraumatic, no cyanosis or edema Skin: Skin color, texture, turgor normal. No rashes or lesions Lymph nodes: Cervical, supraclavicular, and axillary nodes normal. No  abnormal inguinal nodes palpated Neurologic: Grossly normal   Pelvic: External genitalia:  no lesions              Urethra:  normal appearing urethra with no masses, tenderness or lesions              Bartholins and Skenes: normal                 Vagina: normal appearing vagina with normal color and discharge, no lesions              Cervix: no lesions               Bimanual Exam:  Uterus:  normal size, contour, position, consistency, mobility, non-tender              Adnexa: no mass, fullness,  tenderness               Rectovaginal: Confirms               Anus:  normal sphincter tone, no lesions  Chaperone was present for exam.  A:  Well Woman with normal exam  H/O gestational diabetes x 1  Contraception, doing well with the nexplanon    P:   Pap with hpv  HgbA1C  Normal lipids at work  Discussed breast self exam  Discussed calcium and vit D  Return for nexplanon removal and reinsertion in 4/17

## 2014-12-09 NOTE — Patient Instructions (Signed)

## 2014-12-11 LAB — IPS PAP TEST WITH HPV

## 2015-12-13 ENCOUNTER — Ambulatory Visit: Payer: 59 | Admitting: Obstetrics and Gynecology
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
